# Patient Record
Sex: Female | Born: 1990 | Race: Black or African American | Hispanic: No | Marital: Single | Smoking: Never smoker
Health system: Southern US, Community
[De-identification: ages and names within clinical notes are randomized; demographics above are authoritative.]

## PROBLEM LIST (undated history)

## (undated) HISTORY — PX: TONSILLECTOMY: SUR1361

---
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
## 2016-02-07 ENCOUNTER — Encounter: Payer: Self-pay | Admitting: Orthopedic Surgery

## 2021-04-11 ENCOUNTER — Emergency Department (HOSPITAL_COMMUNITY)
Admission: EM | Admit: 2021-04-11 | Discharge: 2021-04-11 | Discharge status: Against Medical Advice | Payer: 59 | Source: Home / Self Care

## 2021-04-12 ENCOUNTER — Encounter (HOSPITAL_COMMUNITY): Payer: Self-pay

## 2021-04-12 ENCOUNTER — Emergency Department (HOSPITAL_COMMUNITY)
Admission: EM | Admit: 2021-04-12 | Discharge: 2021-04-12 | Disposition: A | Discharge status: Home / Self Care | Payer: PRIVATE HEALTH INSURANCE | Attending: Emergency Medicine | Admitting: Emergency Medicine

## 2021-04-12 ENCOUNTER — Other Ambulatory Visit: Payer: Self-pay

## 2021-04-12 DIAGNOSIS — R748 Abnormal levels of other serum enzymes: Secondary | ICD-10-CM | POA: Insufficient documentation

## 2021-04-12 DIAGNOSIS — Z20822 Contact with and (suspected) exposure to covid-19: Secondary | ICD-10-CM | POA: Insufficient documentation

## 2021-04-12 DIAGNOSIS — D72829 Elevated white blood cell count, unspecified: Secondary | ICD-10-CM | POA: Diagnosis not present

## 2021-04-12 DIAGNOSIS — R12 Heartburn: Secondary | ICD-10-CM | POA: Diagnosis not present

## 2021-04-12 DIAGNOSIS — R112 Nausea with vomiting, unspecified: Secondary | ICD-10-CM | POA: Diagnosis present

## 2021-04-12 LAB — CBC WITH DIFFERENTIAL/PLATELET
Abs Immature Granulocytes: 0.03 10*3/uL (ref 0.00–0.07)
Basophils Absolute: 0 10*3/uL (ref 0.0–0.1)
Basophils Relative: 0 %
Eosinophils Absolute: 0.1 10*3/uL (ref 0.0–0.5)
Eosinophils Relative: 1 %
HCT: 44.9 % (ref 36.0–46.0)
Hemoglobin: 15.1 g/dL — ABNORMAL HIGH (ref 12.0–15.0)
Immature Granulocytes: 0 %
Lymphocytes Relative: 19 %
Lymphs Abs: 2.1 10*3/uL (ref 0.7–4.0)
MCH: 29.3 pg (ref 26.0–34.0)
MCHC: 33.6 g/dL (ref 30.0–36.0)
MCV: 87.2 fL (ref 80.0–100.0)
Monocytes Absolute: 0.9 10*3/uL (ref 0.1–1.0)
Monocytes Relative: 8 %
Neutro Abs: 7.9 10*3/uL — ABNORMAL HIGH (ref 1.7–7.7)
Neutrophils Relative %: 72 %
Platelets: 410 10*3/uL — ABNORMAL HIGH (ref 150–400)
RBC: 5.15 MIL/uL — ABNORMAL HIGH (ref 3.87–5.11)
RDW: 12.4 % (ref 11.5–15.5)
WBC: 11 10*3/uL — ABNORMAL HIGH (ref 4.0–10.5)
nRBC: 0 % (ref 0.0–0.2)

## 2021-04-12 LAB — COMPREHENSIVE METABOLIC PANEL
ALT: 25 U/L (ref 0–44)
AST: 21 U/L (ref 15–41)
Albumin: 4.4 g/dL (ref 3.5–5.0)
Alkaline Phosphatase: 32 U/L — ABNORMAL LOW (ref 38–126)
Anion gap: 12 (ref 5–15)
BUN: 13 mg/dL (ref 6–20)
CO2: 22 mmol/L (ref 22–32)
Calcium: 9.3 mg/dL (ref 8.9–10.3)
Chloride: 102 mmol/L (ref 98–111)
Creatinine, Ser: 0.94 mg/dL (ref 0.44–1.00)
GFR, Estimated: >60 mL/min (ref >60)
Glucose, Bld: 121 mg/dL — ABNORMAL HIGH (ref 70–99)
Potassium: 3.3 mmol/L — ABNORMAL LOW (ref 3.5–5.1)
Sodium: 136 mmol/L (ref 135–145)
Total Bilirubin: 1.4 mg/dL — ABNORMAL HIGH (ref 0.3–1.2)
Total Protein: 7.8 g/dL (ref 6.5–8.1)

## 2021-04-12 LAB — URINALYSIS, ROUTINE W REFLEX MICROSCOPIC
Bilirubin Urine: NEGATIVE
Glucose, UA: NEGATIVE mg/dL
Hgb urine dipstick: NEGATIVE
Ketones, ur: NEGATIVE mg/dL
Leukocytes,Ua: NEGATIVE
Nitrite: NEGATIVE
Protein, ur: 30 mg/dL — AB
Specific Gravity, Urine: 1.025 (ref 1.005–1.030)
pH: 6.5 (ref 5.0–8.0)

## 2021-04-12 LAB — URINALYSIS, MICROSCOPIC (REFLEX)

## 2021-04-12 LAB — POC URINE PREG, ED: Preg Test, Ur: NEGATIVE

## 2021-04-12 LAB — RESP PANEL BY RT-PCR (FLU A&B, COVID) ARPGX2
Influenza A by PCR: NEGATIVE
Influenza B by PCR: NEGATIVE
SARS Coronavirus 2 by RT PCR: NEGATIVE

## 2021-04-12 LAB — LIPASE, BLOOD: Lipase: 63 U/L — ABNORMAL HIGH (ref 11–51)

## 2021-04-12 MED ORDER — ALUM & MAG HYDROXIDE-SIMETH 200-200-20 MG/5ML PO SUSP
30.0000 mL | Freq: Once | ORAL | Status: AC
  Administered 2021-04-12: 30 mL via ORAL
  Filled 2021-04-12: qty 30

## 2021-04-12 MED ORDER — LIDOCAINE VISCOUS HCL 2 % MT SOLN
15.0000 mL | Freq: Once | OROMUCOSAL | Status: AC
  Administered 2021-04-12: 15 mL via ORAL
  Filled 2021-04-12: qty 15

## 2021-04-12 MED ORDER — ONDANSETRON HCL 4 MG/2ML IJ SOLN
4.0000 mg | Freq: Once | INTRAMUSCULAR | Status: AC
  Administered 2021-04-12: 4 mg via INTRAVENOUS
  Filled 2021-04-12: qty 2

## 2021-04-12 MED ORDER — ONDANSETRON 4 MG PO TBDP: 4.0000 mg | ORAL_TABLET | Freq: Three times a day (TID) | ORAL | 0 refills | Status: AC | PRN

## 2021-04-12 NOTE — ED Notes (Addendum)
Note in error.

## 2021-04-12 NOTE — ED Triage Notes (Signed)
Pt reports nausea and vomiting since Wednesday. Pt reports abd "discomfort" and heartburn from all the vomiting. Pt says she cannot keep anything down.

## 2021-04-12 NOTE — ED Notes (Signed)
Gave gingerale

## 2021-04-12 NOTE — ED Notes (Signed)
Gave ice

## 2021-04-12 NOTE — ED Provider Notes (Signed)
[facility] EMERGENCY DEPARTMENT Provider Note   CSN: [id number] Arrival date & time: 04/12/21  6967     History No chief complaint on file.   Kristen Cochran is a 30 y.o. female.  Patient presents to the emergency department for evaluation of nausea and vomiting.  Patient reports that symptoms have been present for 2 days.  Patient reports that she has not been able to hold anything down.  She does have some "heartburn" since the vomiting began, but does not normally suffer from heartburn.  She reports some discomfort associated with the nausea, but no specific abdominal pain.  She has not had any diarrhea.  She is a Engineer, civil (consulting), has had sick contacts at work.      History reviewed. No pertinent past medical history.  Patient Active Problem List   Diagnosis Date Noted   Ankle sprain 07/01/2011    Past Surgical History:  Procedure Laterality Date   TONSILLECTOMY       OB History   No obstetric history on file.     Family History  Problem Relation Age of Onset   Heart disease Other    Arthritis Other    Lung disease Other    Asthma Other    Diabetes Other     Social History   Tobacco Use   Smoking status: Never  Vaping Use   Vaping Use: Never used  Substance Use Topics   Alcohol use: No   Drug use: No    Home Medications Prior to Admission medications   Medication Sig Start Date End Date Taking? Authorizing Provider  ondansetron (ZOFRAN-ODT) 4 MG disintegrating tablet Take 1 tablet (4 mg total) by mouth every 8 (eight) hours as needed for nausea or vomiting. 04/12/21  Yes Xyla Leisner, Canary Brim, MD  IBUPROFEN PO Take by mouth.    [provider]    Allergies    Patient has no known allergies.  Review of Systems   Review of Systems  Gastrointestinal:  Positive for nausea and vomiting.  All other systems reviewed and are negative.  Physical Exam Updated Vital Signs BP 138/81   Pulse (!) 113   Temp 97.6 F (36.4 C) (Oral)   Resp 16   Ht  5\' 11"  (1.803 m)   Wt 83.9 kg   SpO2 97%   BMI 25.80 kg/m   Physical Exam Vitals and nursing note reviewed.  Constitutional:      General: She is not in acute distress.    Appearance: Normal appearance. She is well-developed.  HENT:     Head: Normocephalic and atraumatic.     Right Ear: Hearing normal.     Left Ear: Hearing normal.     Nose: Nose normal.  Eyes:     Conjunctiva/sclera: Conjunctivae normal.     Pupils: Pupils are equal, round, and reactive to light.  Cardiovascular:     Rate and Rhythm: Regular rhythm.     Heart sounds: S1 normal and S2 normal. No murmur heard.   No friction rub. No gallop.  Pulmonary:     Effort: Pulmonary effort is normal. No respiratory distress.     Breath sounds: Normal breath sounds.  Chest:     Chest wall: No tenderness.  Abdominal:     General: Bowel sounds are normal.     Palpations: Abdomen is soft.     Tenderness: There is no abdominal tenderness. There is no guarding or rebound. Negative signs include Murphy's sign and McBurney's sign.  Hernia: No hernia is present.  Musculoskeletal:        General: Normal range of motion.     Cervical back: Normal range of motion and neck supple.  Skin:    General: Skin is warm and dry.     Findings: No rash.  Neurological:     Mental Status: She is alert and oriented to person, place, and time.     GCS: GCS eye subscore is 4. GCS verbal subscore is 5. GCS motor subscore is 6.     Cranial Nerves: No cranial nerve deficit.     Sensory: No sensory deficit.     Coordination: Coordination normal.  Psychiatric:        Speech: Speech normal.        Behavior: Behavior normal.        Thought Content: Thought content normal.    ED Results / Procedures / Treatments   Labs (all labs ordered are listed, but only abnormal results are displayed) Labs Reviewed  CBC WITH DIFFERENTIAL/PLATELET - Abnormal; Notable for the following components:      Result Value   WBC 11.0 (*)    RBC 5.15 (*)     Hemoglobin 15.1 (*)    Platelets 410 (*)    Neutro Abs 7.9 (*)    All other components within normal limits  COMPREHENSIVE METABOLIC PANEL - Abnormal; Notable for the following components:   Potassium 3.3 (*)    Glucose, Bld 121 (*)    Alkaline Phosphatase 32 (*)    Total Bilirubin 1.4 (*)    All other components within normal limits  LIPASE, BLOOD - Abnormal; Notable for the following components:   Lipase 63 (*)    All other components within normal limits  URINALYSIS, ROUTINE W REFLEX MICROSCOPIC - Abnormal; Notable for the following components:   Protein, ur 30 (*)    All other components within normal limits  URINALYSIS, MICROSCOPIC (REFLEX) - Abnormal; Notable for the following components:   Bacteria, UA MANY (*)    All other components within normal limits  RESP PANEL BY RT-PCR (FLU A&B, COVID) ARPGX2  POC URINE PREG, ED    EKG None  Radiology No results found.  Procedures Procedures   Medications Ordered in ED Medications  ondansetron (ZOFRAN) injection 4 mg (4 mg Intravenous Given 04/12/21 0516)  alum & mag hydroxide-simeth (MAALOX/MYLANTA) 200-200-20 MG/5ML suspension 30 mL (30 mLs Oral Given 04/12/21 0644)    And  lidocaine (XYLOCAINE) 2 % viscous mouth solution 15 mL (15 mLs Oral Given 04/12/21 7846)    ED Course  I have reviewed the triage vital signs and the nursing notes.  Pertinent labs & imaging results that were available during my care of the patient were reviewed by me and considered in my medical decision making (see chart for details).    MDM Rules/Calculators/A&P                           Patient presents to the emergency department for evaluation of nausea and vomiting.  Symptoms ongoing for 2 days.  Etiology is unclear.  He does not have any associated abdominal pain.  Abdominal exam is benign, no right upper quadrant tenderness.  No tenderness at McBurney's point.  Lab work reveals very slight leukocytosis at 10.9.  Lipase is slightly  elevated at 63, nondiagnostic.  Patient hydrated and medicated, feeling better.  Slight lab abnormalities discussed with the patient.  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  I did discuss with her possibility of CAT scan versus symptomatic treatment and return if needed.  Final Clinical Impression(s) / ED Diagnoses Final diagnoses:  Nausea and vomiting, unspecified vomiting type    Rx / DC Orders ED Discharge Orders          Ordered    ondansetron (ZOFRAN-ODT) 4 MG disintegrating tablet  Every 8 hours PRN        04/12/21 0758             Gilda Crease, MD 04/12/21 [phone]

## 2021-04-23 ENCOUNTER — Encounter: Payer: Self-pay | Admitting: Internal Medicine

## 2021-08-26 NOTE — Progress Notes (Deleted)
   Referring Provider:  [facility] Primary Care Physician:  [facility] Primary Gastroenterologist:  Dr. Marletta Lor  No chief complaint on file.   HPI:   Kristen Cochran is a 31 y.o. female presenting today at the request of  [facility] for nausea/vomiting postprandially.      No past medical history on file.  Past Surgical History:  Procedure Laterality Date   TONSILLECTOMY      Current Outpatient Medications  Medication Sig Dispense Refill   IBUPROFEN PO Take by mouth.     ondansetron (ZOFRAN-ODT) 4 MG disintegrating tablet Take 1 tablet (4 mg total) by mouth every 8 (eight) hours as needed for nausea or vomiting. 20 tablet 0   No current facility-administered medications for this visit.    Allergies as of 08/28/2021   (No Known Allergies)    Family History  Problem Relation Age of Onset   Heart disease Other    Arthritis Other    Lung disease Other    Asthma Other    Diabetes Other     Social History   Socioeconomic History   Marital status: Single    Spouse name: Not on file   Number of children: Not on file   Years of education: Not on file   Highest education level: Not on file  Occupational History   Not on file  Tobacco Use   Smoking status: Never   Smokeless tobacco: Not on file  Vaping Use   Vaping Use: Never used  Substance and Sexual Activity   Alcohol use: No   Drug use: No   Sexual activity: Not on file  Other Topics Concern   Not on file  Social History Narrative   Not on file   Social Determinants of Health   Financial Resource Strain: Not on file  Food Insecurity: Not on file  Transportation Needs: Not on file  Physical Activity: Not on file  Stress: Not on file  Social Connections: Not on file  Intimate Partner Violence: Not on file    Review of Systems: Gen: Denies any fever, chills,  CV: Denies chest pain, heart palpitations Resp: Denies shortness of  breath or cough.  GI: See HPI GU : Denies urinary burning, urinary frequency, urinary hesitancy MS: Denies joint pain.  Derm: Denies rash. Psych: Denies depression, anxiety. Heme: See HPI  Physical Exam: There were no vitals taken for this visit. General:   Alert and oriented. Pleasant and cooperative. Well-nourished and well-developed.  Head:  Normocephalic and atraumatic. Eyes:  Without icterus, sclera clear and conjunctiva pink.  Ears:  Normal auditory acuity. Lungs:  Clear to auscultation bilaterally. No wheezes, rales, or rhonchi. No distress.  Heart:  S1, S2 present without murmurs appreciated.  Abdomen:  +BS, soft, non-tender and non-distended. No HSM noted. No guarding or rebound. No masses appreciated.  Rectal:  Deferred  Msk:  Symmetrical without gross deformities. Normal posture. Extremities:  Without edema. Neurologic:  Alert and  oriented x4;  grossly normal neurologically. Skin:  Intact without significant lesions or rashes. Psych:  Normal mood and affect.    Assessment:     Plan:  ***   Ermalinda Memos, PA-C [facility] 08/28/2021

## 2021-08-28 ENCOUNTER — Ambulatory Visit: Payer: 59 | Admitting: Gastroenterology

## 2022-01-11 DIAGNOSIS — Z3A31 31 weeks gestation of pregnancy: Secondary | ICD-10-CM | POA: Diagnosis not present

## 2022-01-11 DIAGNOSIS — R519 Headache, unspecified: Secondary | ICD-10-CM | POA: Diagnosis not present

## 2022-01-11 DIAGNOSIS — J069 Acute upper respiratory infection, unspecified: Secondary | ICD-10-CM | POA: Diagnosis not present

## 2022-01-11 DIAGNOSIS — Z20822 Contact with and (suspected) exposure to covid-19: Secondary | ICD-10-CM | POA: Diagnosis not present

## 2022-01-11 DIAGNOSIS — O99513 Diseases of the respiratory system complicating pregnancy, third trimester: Secondary | ICD-10-CM | POA: Diagnosis not present

## 2022-01-11 DIAGNOSIS — B9789 Other viral agents as the cause of diseases classified elsewhere: Secondary | ICD-10-CM | POA: Diagnosis not present

## 2022-02-12 DIAGNOSIS — Z3689 Encounter for other specified antenatal screening: Secondary | ICD-10-CM | POA: Diagnosis not present

## 2022-02-26 DIAGNOSIS — O98813 Other maternal infectious and parasitic diseases complicating pregnancy, third trimester: Secondary | ICD-10-CM | POA: Diagnosis not present

## 2022-02-26 DIAGNOSIS — Z3A36 36 weeks gestation of pregnancy: Secondary | ICD-10-CM | POA: Diagnosis not present

## 2022-02-26 DIAGNOSIS — A749 Chlamydial infection, unspecified: Secondary | ICD-10-CM | POA: Diagnosis not present

## 2022-03-12 DIAGNOSIS — O480 Post-term pregnancy: Secondary | ICD-10-CM | POA: Diagnosis not present

## 2022-03-12 DIAGNOSIS — Z3A40 40 weeks gestation of pregnancy: Secondary | ICD-10-CM | POA: Diagnosis not present

## 2022-03-13 DIAGNOSIS — J45909 Unspecified asthma, uncomplicated: Secondary | ICD-10-CM | POA: Diagnosis not present

## 2022-03-13 DIAGNOSIS — Z3490 Encounter for supervision of normal pregnancy, unspecified, unspecified trimester: Secondary | ICD-10-CM | POA: Diagnosis not present

## 2022-03-13 DIAGNOSIS — Z3A40 40 weeks gestation of pregnancy: Secondary | ICD-10-CM | POA: Diagnosis not present

## 2022-03-13 DIAGNOSIS — O9952 Diseases of the respiratory system complicating childbirth: Secondary | ICD-10-CM | POA: Diagnosis not present

## 2022-03-13 DIAGNOSIS — Z3A00 Weeks of gestation of pregnancy not specified: Secondary | ICD-10-CM | POA: Diagnosis not present

## 2023-11-23 DIAGNOSIS — Z0000 Encounter for general adult medical examination without abnormal findings: Secondary | ICD-10-CM | POA: Diagnosis not present

## 2023-11-23 DIAGNOSIS — Z13220 Encounter for screening for lipoid disorders: Secondary | ICD-10-CM | POA: Diagnosis not present

## 2023-11-23 DIAGNOSIS — Z131 Encounter for screening for diabetes mellitus: Secondary | ICD-10-CM | POA: Diagnosis not present

## 2023-11-23 DIAGNOSIS — Z6835 Body mass index (BMI) 35.0-35.9, adult: Secondary | ICD-10-CM | POA: Diagnosis not present

## 2023-11-23 DIAGNOSIS — Z1329 Encounter for screening for other suspected endocrine disorder: Secondary | ICD-10-CM | POA: Diagnosis not present

## 2023-12-01 DIAGNOSIS — E871 Hypo-osmolality and hyponatremia: Secondary | ICD-10-CM | POA: Diagnosis not present
